# Patient Record
Sex: Male | Born: 2000 | Race: White | Hispanic: No | Marital: Single | Smoking: Current some day smoker
Health system: Southern US, Community
[De-identification: ages and names within clinical notes are randomized; demographics above are authoritative.]

---
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
## 2021-05-23 DIAGNOSIS — Z23 Encounter for immunization: Secondary | ICD-10-CM | POA: Diagnosis not present

## 2021-05-23 DIAGNOSIS — R69 Illness, unspecified: Secondary | ICD-10-CM | POA: Diagnosis not present

## 2021-05-23 DIAGNOSIS — F419 Anxiety disorder, unspecified: Secondary | ICD-10-CM | POA: Diagnosis not present

## 2021-06-30 DIAGNOSIS — R69 Illness, unspecified: Secondary | ICD-10-CM | POA: Diagnosis not present

## 2021-06-30 DIAGNOSIS — F418 Other specified anxiety disorders: Secondary | ICD-10-CM | POA: Diagnosis not present

## 2021-08-03 DIAGNOSIS — J069 Acute upper respiratory infection, unspecified: Secondary | ICD-10-CM | POA: Diagnosis not present

## 2021-09-09 DIAGNOSIS — R0683 Snoring: Secondary | ICD-10-CM | POA: Diagnosis not present

## 2021-09-09 DIAGNOSIS — Z23 Encounter for immunization: Secondary | ICD-10-CM | POA: Diagnosis not present

## 2021-11-15 DIAGNOSIS — Z20828 Contact with and (suspected) exposure to other viral communicable diseases: Secondary | ICD-10-CM | POA: Diagnosis not present

## 2021-11-15 DIAGNOSIS — R110 Nausea: Secondary | ICD-10-CM | POA: Diagnosis not present

## 2021-11-15 DIAGNOSIS — R197 Diarrhea, unspecified: Secondary | ICD-10-CM | POA: Diagnosis not present

## 2021-12-08 DIAGNOSIS — Z23 Encounter for immunization: Secondary | ICD-10-CM | POA: Diagnosis not present

## 2021-12-08 DIAGNOSIS — Z713 Dietary counseling and surveillance: Secondary | ICD-10-CM | POA: Diagnosis not present

## 2021-12-08 DIAGNOSIS — Z0001 Encounter for general adult medical examination with abnormal findings: Secondary | ICD-10-CM | POA: Diagnosis not present

## 2021-12-08 DIAGNOSIS — Z6826 Body mass index (BMI) 26.0-26.9, adult: Secondary | ICD-10-CM | POA: Diagnosis not present

## 2021-12-08 DIAGNOSIS — R69 Illness, unspecified: Secondary | ICD-10-CM | POA: Diagnosis not present

## 2022-03-23 DIAGNOSIS — H109 Unspecified conjunctivitis: Secondary | ICD-10-CM | POA: Diagnosis not present

## 2022-06-14 DIAGNOSIS — R69 Illness, unspecified: Secondary | ICD-10-CM | POA: Diagnosis not present

## 2022-06-14 DIAGNOSIS — F321 Major depressive disorder, single episode, moderate: Secondary | ICD-10-CM | POA: Diagnosis not present

## 2022-08-24 ENCOUNTER — Ambulatory Visit: Payer: 59 | Admitting: Physician Assistant

## 2022-08-24 ENCOUNTER — Encounter: Payer: Self-pay | Admitting: Physician Assistant

## 2022-08-24 VITALS — BP 122/77 | HR 68 | Temp 97.3°F | Resp 12 | Ht 72.0 in | Wt 214.0 lb

## 2022-08-24 DIAGNOSIS — F988 Other specified behavioral and emotional disorders with onset usually occurring in childhood and adolescence: Secondary | ICD-10-CM

## 2022-08-24 DIAGNOSIS — F411 Generalized anxiety disorder: Secondary | ICD-10-CM | POA: Insufficient documentation

## 2022-08-24 NOTE — Progress Notes (Signed)
Dr. Chelsea Primus of [facility] prescribed Prozac & Javanni states the dose was increased to 60 mg.  Feels like he has ADHD: Forgetfulness Academic struggles - presently student in college

## 2022-08-24 NOTE — Progress Notes (Signed)
   Subjective: Attention deficit    Patient ID: Christian Walsh, male    DOB: November 23, 2000, 21 y.o.   MRN: [id number]  HPI Patient 21 year old male who states he has problems staying focused especially academic works.  Patient said probably  worsen after entering college.  Request evaluation for ADD.    Review of Systems Generalized anxiety disorder    Objective:   Physical Exam  BP is 122/77, pulse 68, respiration 12, temperature 97.3, and patient 90% O2 sat on room air. Physical exam was deferred.      Assessment & Plan: Attention deficit   Will consult patient to [facility] attention specialist for further evaluation and treatment.

## 2022-09-15 DIAGNOSIS — F900 Attention-deficit hyperactivity disorder, predominantly inattentive type: Secondary | ICD-10-CM | POA: Diagnosis not present

## 2022-09-15 DIAGNOSIS — F439 Reaction to severe stress, unspecified: Secondary | ICD-10-CM | POA: Diagnosis not present

## 2022-09-15 DIAGNOSIS — R69 Illness, unspecified: Secondary | ICD-10-CM | POA: Diagnosis not present

## 2022-10-16 ENCOUNTER — Ambulatory Visit: Payer: Self-pay | Admitting: Physician Assistant

## 2022-10-16 ENCOUNTER — Encounter: Payer: Self-pay | Admitting: Physician Assistant

## 2022-10-16 DIAGNOSIS — F330 Major depressive disorder, recurrent, mild: Secondary | ICD-10-CM

## 2022-10-16 DIAGNOSIS — F439 Reaction to severe stress, unspecified: Secondary | ICD-10-CM

## 2022-10-16 DIAGNOSIS — F900 Attention-deficit hyperactivity disorder, predominantly inattentive type: Secondary | ICD-10-CM

## 2022-10-16 MED ORDER — FLUOXETINE HCL 40 MG PO CAPS: 40.0000 mg | ORAL_CAPSULE | Freq: Every day | ORAL | 3 refills | Status: DC

## 2022-10-16 MED ORDER — AMPHETAMINE-DEXTROAMPHET ER 30 MG PO CP24: 30.0000 mg | ORAL_CAPSULE | ORAL | 0 refills | Status: DC

## 2022-10-16 NOTE — Progress Notes (Signed)
   Subjective: ADHD    Patient ID: Christian Walsh, male    DOB: 05/17/2001, 22 y.o.   MRN: [id number]  HPI Patient is follow-up status post evaluation by [facility].  Patient is been diagnosed with ADHD, major depressive disorder, unspecified trauma and stress related disorder, and obsessive-compulsive disorder.  Patient has been advised to consider cognitive behavioral therapy.  Patient advised to continue taking Fluoxetine.  Patient will start Adderall at 20 mg and follow-up in 2 weeks.   Review of Systems Negative except for above    Objective:   Physical Exam Deferred secondary to telephonic encounter       Assessment & Plan: ADHD, major depressive disorder and unspecified trauma and stress-related disorder   Patient will continue previous medication and start Adderall 20 mg.  Patient will follow-up in 2 weeks.

## 2022-10-16 NOTE — Progress Notes (Signed)
Notes provided from Counselor with plan/recommendations and noted new Dx.  Request to discuss with provider about ADHD and medication.  Also stated he needs a refill for Prozac.

## 2022-11-15 ENCOUNTER — Ambulatory Visit: Payer: Self-pay | Admitting: Physician Assistant

## 2022-11-15 ENCOUNTER — Other Ambulatory Visit: Payer: Self-pay | Admitting: Physician Assistant

## 2022-11-15 ENCOUNTER — Encounter: Payer: Self-pay | Admitting: Physician Assistant

## 2022-11-15 DIAGNOSIS — Z760 Encounter for issue of repeat prescription: Secondary | ICD-10-CM

## 2022-11-15 MED ORDER — AMPHETAMINE-DEXTROAMPHET ER 30 MG PO CP24: 30.0000 mg | ORAL_CAPSULE | ORAL | 0 refills | Status: DC

## 2022-11-15 NOTE — Progress Notes (Signed)
   Subjective: Medication refill     Patient ID: Christian Walsh, male    DOB: [birth year], 22 y.o.   MRN: [id number]  HPI Patient requests medication refill for Adderall.  Patient states remarkable improvement status post starting medicine last month.   Review of Systems Generalized anxiety disorder and ADHD    Objective:   Physical Exam Deferred secondary to this being a telephonic encounter       Assessment & Plan: Medication refill   Adderall 20 mg refilled for 1 month.

## 2022-11-15 NOTE — Progress Notes (Signed)
   Subjective: Medication refill    Patient ID: Christian Walsh, male    DOB: 10/01/2000, 22 y.o.   MRN: [id number]  HPI Patient follow-up for medication refill of Adderall.  Patient started medication last month after evaluation by [place] attention specialist.  Patient states remarkable improvement in his behavior since starting medication.  States no side effects.   Review of Systems ADHD and anxiety    Objective:   Physical Exam Deferred secondary to this being a telephonic encounter       Assessment & Plan: Medication refill   Adderall refill at 20 mg daily for 1 month.  Follow-up monthly.

## 2022-11-17 ENCOUNTER — Other Ambulatory Visit: Payer: Self-pay | Admitting: Physician Assistant

## 2022-11-17 ENCOUNTER — Other Ambulatory Visit: Payer: Self-pay

## 2022-11-17 DIAGNOSIS — F900 Attention-deficit hyperactivity disorder, predominantly inattentive type: Secondary | ICD-10-CM

## 2022-11-17 MED ORDER — AMPHETAMINE-DEXTROAMPHET ER 30 MG PO CP24: 30.0000 mg | ORAL_CAPSULE | ORAL | 0 refills | Status: DC

## 2022-12-25 ENCOUNTER — Other Ambulatory Visit: Payer: Self-pay | Admitting: Physician Assistant

## 2022-12-25 ENCOUNTER — Other Ambulatory Visit: Payer: Self-pay

## 2022-12-25 ENCOUNTER — Telehealth: Payer: Self-pay

## 2022-12-25 DIAGNOSIS — F900 Attention-deficit hyperactivity disorder, predominantly inattentive type: Secondary | ICD-10-CM

## 2022-12-25 MED ORDER — AMPHETAMINE-DEXTROAMPHET ER 30 MG PO CP24: 30.0000 mg | ORAL_CAPSULE | ORAL | 0 refills | Status: DC

## 2022-12-26 NOTE — Telephone Encounter (Signed)
JB

## 2023-02-01 ENCOUNTER — Other Ambulatory Visit: Payer: Self-pay

## 2023-02-01 DIAGNOSIS — F900 Attention-deficit hyperactivity disorder, predominantly inattentive type: Secondary | ICD-10-CM

## 2023-02-01 MED ORDER — AMPHETAMINE-DEXTROAMPHET ER 30 MG PO CP24: 30.0000 mg | ORAL_CAPSULE | ORAL | 0 refills | Status: DC

## 2023-03-12 ENCOUNTER — Other Ambulatory Visit: Payer: Self-pay

## 2023-03-12 DIAGNOSIS — F900 Attention-deficit hyperactivity disorder, predominantly inattentive type: Secondary | ICD-10-CM

## 2023-03-12 MED ORDER — AMPHETAMINE-DEXTROAMPHET ER 30 MG PO CP24: 30.0000 mg | ORAL_CAPSULE | ORAL | 0 refills | Status: DC

## 2023-03-29 ENCOUNTER — Other Ambulatory Visit: Payer: Self-pay

## 2023-03-29 NOTE — Telephone Encounter (Signed)
Stated he spilled some Adderall pills and inquiring about early refill.  Informed that we cannot submit refill this early and will submit as provider is appropriate to do so.  He stated he doesn't have to take the med on the weekends.  He will schedule in person visit with provider when in [place] area prior to September.  Recommended to find provider near [place] if he is living up there primarily as our clinic is not open on the weekends.  He stated understanding of this.

## 2023-04-05 ENCOUNTER — Other Ambulatory Visit: Payer: Self-pay

## 2023-04-05 DIAGNOSIS — F900 Attention-deficit hyperactivity disorder, predominantly inattentive type: Secondary | ICD-10-CM

## 2023-04-05 MED ORDER — AMPHETAMINE-DEXTROAMPHET ER 30 MG PO CP24: 30.0000 mg | ORAL_CAPSULE | ORAL | 0 refills | Status: DC

## 2023-05-16 ENCOUNTER — Telehealth: Payer: Self-pay

## 2023-05-16 NOTE — Telephone Encounter (Signed)
Gave consent and requested office notes with noted Dx and Meds ordered with visits here at clinic.  He is going to see the [facility] at his [place] and notes faxed at request to [phone].

## 2023-06-08 DIAGNOSIS — M79631 Pain in right forearm: Secondary | ICD-10-CM | POA: Diagnosis not present

## 2023-06-08 DIAGNOSIS — M25511 Pain in right shoulder: Secondary | ICD-10-CM | POA: Diagnosis not present

## 2023-06-08 DIAGNOSIS — M25521 Pain in right elbow: Secondary | ICD-10-CM | POA: Diagnosis not present

## 2023-06-08 DIAGNOSIS — Z79899 Other long term (current) drug therapy: Secondary | ICD-10-CM | POA: Diagnosis not present

## 2023-06-08 DIAGNOSIS — M25531 Pain in right wrist: Secondary | ICD-10-CM | POA: Diagnosis not present

## 2023-06-08 DIAGNOSIS — M79621 Pain in right upper arm: Secondary | ICD-10-CM | POA: Diagnosis not present

## 2023-06-08 DIAGNOSIS — Z881 Allergy status to other antibiotic agents status: Secondary | ICD-10-CM | POA: Diagnosis not present

## 2023-06-08 DIAGNOSIS — M79601 Pain in right arm: Secondary | ICD-10-CM | POA: Diagnosis not present

## 2023-06-08 DIAGNOSIS — Z7952 Long term (current) use of systemic steroids: Secondary | ICD-10-CM | POA: Diagnosis not present

## 2023-06-08 DIAGNOSIS — M79641 Pain in right hand: Secondary | ICD-10-CM | POA: Diagnosis not present

## 2023-06-18 DIAGNOSIS — S53491A Other sprain of right elbow, initial encounter: Secondary | ICD-10-CM | POA: Diagnosis not present

## 2023-06-18 DIAGNOSIS — S52124A Nondisplaced fracture of head of right radius, initial encounter for closed fracture: Secondary | ICD-10-CM | POA: Diagnosis not present

## 2023-06-18 DIAGNOSIS — S63501A Unspecified sprain of right wrist, initial encounter: Secondary | ICD-10-CM | POA: Diagnosis not present

## 2023-12-10 ENCOUNTER — Encounter: Payer: Self-pay | Admitting: Physician Assistant

## 2023-12-10 ENCOUNTER — Ambulatory Visit: Payer: Self-pay | Admitting: Physician Assistant

## 2023-12-10 ENCOUNTER — Other Ambulatory Visit: Payer: Self-pay | Admitting: Physician Assistant

## 2023-12-10 VITALS — BP 119/70 | HR 92 | Temp 98.1°F | Resp 14

## 2023-12-10 DIAGNOSIS — Z760 Encounter for issue of repeat prescription: Secondary | ICD-10-CM

## 2023-12-10 MED ORDER — AMPHETAMINE-DEXTROAMPHET ER 30 MG PO CP24: 30.0000 mg | ORAL_CAPSULE | ORAL | 0 refills | Status: DC

## 2023-12-10 NOTE — Progress Notes (Signed)
   Subjective: ADHD    Patient ID: Christian Walsh, male    DOB: [birth year], 23 y.o.   MRN: [id number]  HPI Patient requesting prescription for Adderall for ADHD.  Patient has relocated to this area recently.  Patient will establish this clinic.   Review of Systems ADHD and anxiety    Objective:   Physical Exam BP 119/70  Pulse Rate 92  Temp 98.1 F (36.7 C)  Resp 14  SpO2 97 %  Physical exam deferred today.       Assessment & Plan: Medication refill  Patient prescription for Adderall 30 mg was filled.  Patient will schedule physical exam in the near future as part of exceptional care at this clinic.

## 2023-12-10 NOTE — Progress Notes (Signed)
 Here for Adderal prescription stating he moved back home to [facility] and wishes to establish PCP with [place] at [facility].  He has run out of his Rx and it cannot be transferred from the [facility] location.  Tolerating meds well.  Will schedule physical to establish care.

## 2024-01-14 ENCOUNTER — Other Ambulatory Visit: Payer: Self-pay

## 2024-01-14 DIAGNOSIS — F909 Attention-deficit hyperactivity disorder, unspecified type: Secondary | ICD-10-CM | POA: Insufficient documentation

## 2024-01-14 DIAGNOSIS — F900 Attention-deficit hyperactivity disorder, predominantly inattentive type: Secondary | ICD-10-CM

## 2024-01-14 DIAGNOSIS — F411 Generalized anxiety disorder: Secondary | ICD-10-CM

## 2024-01-14 MED ORDER — AMPHETAMINE-DEXTROAMPHET ER 30 MG PO CP24: 30.0000 mg | ORAL_CAPSULE | ORAL | 0 refills | Status: DC

## 2024-01-14 MED ORDER — FLUOXETINE HCL 40 MG PO CAPS: 40.0000 mg | ORAL_CAPSULE | Freq: Every day | ORAL | 0 refills | Status: DC

## 2024-02-19 ENCOUNTER — Other Ambulatory Visit: Payer: Self-pay

## 2024-02-19 DIAGNOSIS — F900 Attention-deficit hyperactivity disorder, predominantly inattentive type: Secondary | ICD-10-CM

## 2024-02-19 MED ORDER — AMPHETAMINE-DEXTROAMPHET ER 30 MG PO CP24: 30.0000 mg | ORAL_CAPSULE | ORAL | 0 refills | Status: DC

## 2024-03-18 ENCOUNTER — Ambulatory Visit: Payer: Self-pay

## 2024-03-18 DIAGNOSIS — Z0000 Encounter for general adult medical examination without abnormal findings: Secondary | ICD-10-CM

## 2024-03-18 LAB — POCT URINALYSIS DIPSTICK
Bilirubin, UA: NEGATIVE
Blood, UA: NEGATIVE
Glucose, UA: NEGATIVE
Ketones, UA: NEGATIVE
Leukocytes, UA: NEGATIVE
Nitrite, UA: NEGATIVE
Protein, UA: NEGATIVE
Spec Grav, UA: 1.02 (ref 1.010–1.025)
Urobilinogen, UA: 0.2 U/dL
pH, UA: 6 (ref 5.0–8.0)

## 2024-03-18 NOTE — Progress Notes (Signed)
 Pt completed labs for physical. Gretel Acre

## 2024-03-19 LAB — CMP12+LP+TP+TSH+6AC+CBC/D/PLT
ALT: 18 IU/L (ref 0–44)
AST: 17 IU/L (ref 0–40)
Albumin: 4.9 g/dL (ref 4.3–5.2)
Alkaline Phosphatase: 80 IU/L (ref 44–121)
BUN/Creatinine Ratio: 16 (ref 9–20)
BUN: 14 mg/dL (ref 6–20)
Basophils Absolute: 0 x10E3/uL (ref 0.0–0.2)
Basos: 1 %
Bilirubin Total: 0.6 mg/dL (ref 0.0–1.2)
Calcium: 9.5 mg/dL (ref 8.7–10.2)
Chloride: 101 mmol/L (ref 96–106)
Chol/HDL Ratio: 3.6 ratio (ref 0.0–5.0)
Cholesterol, Total: 176 mg/dL (ref 100–199)
Creatinine, Ser: 0.87 mg/dL (ref 0.76–1.27)
EOS (ABSOLUTE): 0.1 x10E3/uL (ref 0.0–0.4)
Eos: 1 %
Estimated CHD Risk: 0.6 times avg. (ref 0.0–1.0)
Free Thyroxine Index: 1.3 (ref 1.2–4.9)
GGT: 17 IU/L (ref 0–65)
Globulin, Total: 2.5 g/dL (ref 1.5–4.5)
Glucose: 96 mg/dL (ref 70–99)
HDL: 49 mg/dL (ref >39)
Hematocrit: 47.8 % (ref 37.5–51.0)
Hemoglobin: 15.6 g/dL (ref 13.0–17.7)
Immature Grans (Abs): 0 x10E3/uL (ref 0.0–0.1)
Immature Granulocytes: 0 %
Iron: 90 ug/dL (ref 38–169)
LDH: 140 IU/L (ref 121–224)
LDL Chol Calc (NIH): 108 mg/dL — ABNORMAL HIGH (ref 0–99)
Lymphocytes Absolute: 2.2 x10E3/uL (ref 0.7–3.1)
Lymphs: 34 %
MCH: 29.5 pg (ref 26.6–33.0)
MCHC: 32.6 g/dL (ref 31.5–35.7)
MCV: 90 fL (ref 79–97)
Monocytes Absolute: 0.5 x10E3/uL (ref 0.1–0.9)
Monocytes: 8 %
Neutrophils Absolute: 3.7 x10E3/uL (ref 1.4–7.0)
Neutrophils: 56 %
Phosphorus: 2.9 mg/dL (ref 2.8–4.1)
Platelets: 248 x10E3/uL (ref 150–450)
Potassium: 4.5 mmol/L (ref 3.5–5.2)
RBC: 5.29 x10E6/uL (ref 4.14–5.80)
RDW: 12 % (ref 11.6–15.4)
Sodium: 138 mmol/L (ref 134–144)
T3 Uptake Ratio: 27 % (ref 24–39)
T4, Total: 4.7 ug/dL (ref 4.5–12.0)
TSH: 1.1 u[IU]/mL (ref 0.450–4.500)
Total Protein: 7.4 g/dL (ref 6.0–8.5)
Triglycerides: 104 mg/dL (ref 0–149)
Uric Acid: 5.4 mg/dL (ref 3.8–8.4)
VLDL Cholesterol Cal: 19 mg/dL (ref 5–40)
WBC: 6.6 x10E3/uL (ref 3.4–10.8)
eGFR: 124 mL/min/1.73 (ref >59)

## 2024-03-20 ENCOUNTER — Other Ambulatory Visit: Payer: Self-pay

## 2024-03-20 DIAGNOSIS — F900 Attention-deficit hyperactivity disorder, predominantly inattentive type: Secondary | ICD-10-CM

## 2024-03-20 MED ORDER — AMPHETAMINE-DEXTROAMPHET ER 30 MG PO CP24: 30.0000 mg | ORAL_CAPSULE | ORAL | 0 refills | Status: DC

## 2024-03-25 ENCOUNTER — Encounter: Admitting: Physician Assistant

## 2024-03-26 ENCOUNTER — Encounter: Payer: Self-pay | Admitting: Physician Assistant

## 2024-03-26 ENCOUNTER — Ambulatory Visit: Payer: Self-pay | Admitting: Physician Assistant

## 2024-03-26 VITALS — BP 115/66 | HR 80 | Temp 98.2°F | Resp 16 | Ht 71.75 in | Wt 193.0 lb

## 2024-03-26 DIAGNOSIS — Z0000 Encounter for general adult medical examination without abnormal findings: Secondary | ICD-10-CM

## 2024-03-26 NOTE — Progress Notes (Signed)
 Pt presents today to complete physical, Pt didn't voice any concerns at this time Christian Walsh

## 2024-03-26 NOTE — Progress Notes (Signed)
 [facility]   ____________________________________________   None    (approximate)  I have reviewed the triage vital signs and the nursing notes.   HISTORY  Chief Complaint Annual Exam   HPI Christian Walsh is a 23 y.o. male patient presents for annual physical exam.  Patient voices no concerns or complaints.         No past medical history on file.  Patient Active Problem List   Diagnosis Date Noted   Attention deficit hyperactivity disorder (ADHD) 01/14/2024   Generalized anxiety disorder 08/24/2022    No past surgical history on file.  Prior to Admission medications   Medication Sig Start Date End Date Taking? Authorizing Provider  amoxicillin (AMOXIL) 500 MG capsule Take 500 mg by mouth 3 (three) times daily.    [provider]  amphetamine -dextroamphetamine (ADDERALL XR) 30 MG 24 hr capsule Take 1 capsule (30 mg total) by mouth every morning. 03/20/24   Claudene Tanda POUR, PA-C  FLUoxetine  (PROZAC ) 40 MG capsule Take 1 capsule (40 mg total) by mouth daily. 01/14/24   Claudene Tanda POUR, PA-C  meloxicam (MOBIC) 7.5 MG tablet Take 1 tablet twice a day by oral route with meal(s). 06/18/23   [provider]    Allergies Doxycycline and Minocycline  No family history on file.  Social History Social History   Tobacco Use   Smoking status: Some Days    Types: Cigars   Smokeless tobacco: Never  Substance Use Topics   Alcohol use: Yes    Comment: social   Drug use: No    Review of Systems  Constitutional: No fever/chills Eyes: No visual changes. ENT: No sore throat. Cardiovascular: Denies chest pain. Respiratory: Denies shortness of breath. Gastrointestinal: No abdominal pain.  No nausea, no vomiting.  No diarrhea.  No constipation. Genitourinary: Negative for dysuria. Musculoskeletal: Negative for back pain. Skin: Negative for rash. Neurological: Negative for headaches, focal weakness or  numbness. Psychiatric: ADHD and anxiety Allergic/Immunilogical: Doxycycline ____________________________________________   PHYSICAL EXAM:  VITAL SIGNS: BP 115/66  Cuff Size Normal  Pulse Rate 80  Temp 98.2 F (36.8 C)  Temp Source Temporal  Weight 193 lb (87.5 kg)  Height 5' 11.75 (1.822 m)  Resp 16  SpO2 98 %   BMI: 26.36 kg/m2  BSA: 2.10 m2   Constitutional: Alert and oriented. Well appearing and in no acute distress. Eyes: Conjunctivae are normal. PERRL. EOMI. Head: Atraumatic. Nose: No congestion/rhinnorhea. Mouth/Throat: Mucous membranes are moist.  Oropharynx non-erythematous. Neck: No stridor.  No cervical spine tenderness to palpation. Hematological/Lymphatic/Immunilogical: No cervical lymphadenopathy. Cardiovascular: Normal rate, regular rhythm. Grossly normal heart sounds.  Good peripheral circulation. Respiratory: Normal respiratory effort.  No retractions. Lungs CTAB. Gastrointestinal: Soft and nontender. No distention. No abdominal bruits. No CVA tenderness. Genitourinary: Deferred Musculoskeletal: No lower extremity tenderness nor edema.  No joint effusions. Neurologic:  Normal speech and language. No gross focal neurologic deficits are appreciated. No gait instability. Skin:  Skin is warm, dry and intact. No rash noted. Psychiatric: Mood and affect are normal. Speech and behavior are normal.  ____________________________________________   LABS     Component Ref Range & Units (hover) 8 d ago  Color, UA yellow  Clarity, UA clear  Glucose, UA Negative  Bilirubin, UA neg  Ketones, UA neg  Spec Grav, UA 1.020  Blood, UA neg  pH, UA 6.0  Protein, UA Negative  Urobilinogen, UA 0.2  Nitrite, UA neg  Leukocytes, UA Negative  Appearance  Odor            Component Ref Range & Units (hover) 8 d ago (03/18/24) 6 yr ago (10/25/17) 6 yr ago (10/25/17)  Glucose 96 83 R   Uric Acid 5.4    Comment:            Therapeutic target for gout patients:  <6.0  BUN 14 10   Creatinine, Ser 0.87 0.85 R   eGFR 124    BUN/Creatinine Ratio 16    Sodium 138 137 R   Potassium 4.5 3.7 R   Chloride 101 102 R   Calcium 9.5 9.7 R   Phosphorus 2.9    Total Protein 7.4    Albumin 4.9    Globulin, Total 2.5    Bilirubin Total 0.6    Alkaline Phosphatase 80    LDH 140    AST 17    ALT 18    GGT 17    Iron 90    Cholesterol, Total 176    Triglycerides 104    HDL 49    VLDL Cholesterol Cal 19    LDL Chol Calc (NIH) 108 High     Chol/HDL Ratio 3.6    Comment:                                   T. Chol/HDL Ratio                                             Men  Women                               1/2 Avg.Risk  3.4    3.3                                   Avg.Risk  5.0    4.4                                2X Avg.Risk  9.6    7.1                                3X Avg.Risk 23.4   11.0  Estimated CHD Risk 0.6    Comment: The CHD Risk is based on the T. Chol/HDL ratio. Other factors affect CHD Risk such as hypertension, smoking, diabetes, severe obesity, and family history of premature CHD.  TSH 1.100    T4, Total 4.7    T3 Uptake Ratio 27    Free Thyroxine Index 1.3    WBC 6.6  5.6 R  RBC 5.29  5.14 R  Hemoglobin 15.6  15.2 R  Hematocrit 47.8  43.9 R  MCV 90  85.4 R  MCH 29.5  29.6 R  MCHC 32.6  34.6 R  RDW 12.0  13.2 R  Platelets 248  254 R  Neutrophils 56  51 R  Lymphs 34    Monocytes 8    Eos 1    Basos 1    Neutrophils Absolute 3.7  2.8 R  Lymphocytes Absolute  2.2  2.1 R  Monocytes Absolute 0.5    EOS (ABSOLUTE) 0.1    Basophils Absolute 0.0  0.0 R, CM  Immature Granulocytes 0    Immature Grans (Abs) 0.0                ____________________________________________    ____________________________________________   INITIAL IMPRESSION / ASSESSMENT AND PLAN As part of my medical decision making, I reviewed the following data within the electronic MEDICAL RECORD NUMBER      No acute findings on physical exam and labs.         ____________________________________________   FINAL CLINICAL IMPRESSION Well exam   ED Discharge Orders     None        Note:  This document was prepared using Dragon voice recognition software and may include unintentional dictation errors.

## 2024-04-11 ENCOUNTER — Other Ambulatory Visit: Payer: Self-pay | Admitting: Physician Assistant

## 2024-04-11 DIAGNOSIS — F411 Generalized anxiety disorder: Secondary | ICD-10-CM

## 2024-04-29 ENCOUNTER — Other Ambulatory Visit: Payer: Self-pay

## 2024-04-29 ENCOUNTER — Other Ambulatory Visit: Payer: Self-pay | Admitting: Physician Assistant

## 2024-04-29 DIAGNOSIS — F900 Attention-deficit hyperactivity disorder, predominantly inattentive type: Secondary | ICD-10-CM

## 2024-04-29 DIAGNOSIS — F411 Generalized anxiety disorder: Secondary | ICD-10-CM

## 2024-04-29 MED ORDER — AMPHETAMINE-DEXTROAMPHET ER 30 MG PO CP24: 30.0000 mg | ORAL_CAPSULE | ORAL | 0 refills | Status: DC

## 2024-06-12 ENCOUNTER — Other Ambulatory Visit: Payer: Self-pay

## 2024-06-12 DIAGNOSIS — F900 Attention-deficit hyperactivity disorder, predominantly inattentive type: Secondary | ICD-10-CM

## 2024-06-12 MED ORDER — AMPHETAMINE-DEXTROAMPHET ER 30 MG PO CP24: 30.0000 mg | ORAL_CAPSULE | ORAL | 0 refills | Status: DC

## 2024-07-17 ENCOUNTER — Other Ambulatory Visit: Payer: Self-pay

## 2024-07-17 DIAGNOSIS — F900 Attention-deficit hyperactivity disorder, predominantly inattentive type: Secondary | ICD-10-CM

## 2024-07-17 MED ORDER — AMPHETAMINE-DEXTROAMPHET ER 30 MG PO CP24: 30.0000 mg | ORAL_CAPSULE | ORAL | 0 refills | Status: DC

## 2024-08-18 ENCOUNTER — Other Ambulatory Visit: Payer: Self-pay

## 2024-08-18 DIAGNOSIS — F900 Attention-deficit hyperactivity disorder, predominantly inattentive type: Secondary | ICD-10-CM

## 2024-08-18 DIAGNOSIS — F411 Generalized anxiety disorder: Secondary | ICD-10-CM

## 2024-08-18 MED ORDER — FLUOXETINE HCL 40 MG PO CAPS: 40.0000 mg | ORAL_CAPSULE | Freq: Every day | ORAL | 3 refills | Status: AC

## 2024-08-18 MED ORDER — AMPHETAMINE-DEXTROAMPHET ER 30 MG PO CP24: 30.0000 mg | ORAL_CAPSULE | ORAL | 0 refills | Status: AC
